# Patient Record
Sex: Female | Born: 2005 | Race: White | Hispanic: No | Marital: Single | Smoking: Never smoker
Health system: Southern US, Community
[De-identification: ages and names within clinical notes are randomized; demographics above are authoritative.]

## PROBLEM LIST (undated history)

## (undated) DIAGNOSIS — F419 Anxiety disorder, unspecified: Secondary | ICD-10-CM

## (undated) DIAGNOSIS — F32A Depression, unspecified: Secondary | ICD-10-CM

## (undated) DIAGNOSIS — F329 Major depressive disorder, single episode, unspecified: Secondary | ICD-10-CM

## (undated) HISTORY — PX: OTHER SURGICAL HISTORY: SHX169

---
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
## 2023-01-01 DIAGNOSIS — Z0000 Encounter for general adult medical examination without abnormal findings: Secondary | ICD-10-CM | POA: Diagnosis not present

## 2023-01-01 DIAGNOSIS — Z130 Encounter for screening for diseases of the blood and blood-forming organs and certain disorders involving the immune mechanism: Secondary | ICD-10-CM | POA: Diagnosis not present

## 2023-01-01 DIAGNOSIS — Z13220 Encounter for screening for lipoid disorders: Secondary | ICD-10-CM | POA: Diagnosis not present

## 2023-01-01 DIAGNOSIS — Z8701 Personal history of pneumonia (recurrent): Secondary | ICD-10-CM | POA: Diagnosis not present

## 2023-01-22 ENCOUNTER — Other Ambulatory Visit (HOSPITAL_COMMUNITY): Payer: Self-pay

## 2023-01-22 ENCOUNTER — Other Ambulatory Visit: Payer: Self-pay

## 2023-01-22 DIAGNOSIS — F411 Generalized anxiety disorder: Secondary | ICD-10-CM | POA: Diagnosis not present

## 2023-01-22 MED ORDER — VILAZODONE HCL 10 MG PO TABS
10.0000 mg | ORAL_TABLET | Freq: Every day | ORAL | 0 refills | Status: AC
  Filled 2023-01-22 – 2023-02-04 (×2): qty 90, 90d supply, fill #0

## 2023-01-23 ENCOUNTER — Other Ambulatory Visit (HOSPITAL_COMMUNITY): Payer: Self-pay

## 2023-01-23 ENCOUNTER — Other Ambulatory Visit: Payer: Self-pay

## 2023-01-28 ENCOUNTER — Other Ambulatory Visit: Payer: Self-pay

## 2023-02-04 ENCOUNTER — Other Ambulatory Visit (HOSPITAL_COMMUNITY): Payer: Self-pay

## 2023-02-04 ENCOUNTER — Other Ambulatory Visit: Payer: Self-pay

## 2023-02-11 ENCOUNTER — Other Ambulatory Visit (HOSPITAL_COMMUNITY): Payer: Self-pay

## 2023-02-20 ENCOUNTER — Other Ambulatory Visit (HOSPITAL_COMMUNITY): Payer: Self-pay

## 2023-02-21 DIAGNOSIS — F411 Generalized anxiety disorder: Secondary | ICD-10-CM | POA: Diagnosis not present

## 2023-02-22 ENCOUNTER — Other Ambulatory Visit: Payer: Self-pay

## 2023-02-22 ENCOUNTER — Other Ambulatory Visit (HOSPITAL_COMMUNITY): Payer: Self-pay

## 2023-02-22 MED ORDER — VILAZODONE HCL 40 MG PO TABS
40.0000 mg | ORAL_TABLET | Freq: Every day | ORAL | 3 refills | Status: AC
Start: 2023-02-22 — End: ?
  Filled 2023-02-22: qty 90, 90d supply, fill #0

## 2023-04-29 ENCOUNTER — Other Ambulatory Visit (HOSPITAL_COMMUNITY): Payer: Self-pay

## 2023-05-10 ENCOUNTER — Other Ambulatory Visit (HOSPITAL_COMMUNITY): Payer: Self-pay

## 2023-05-10 DIAGNOSIS — Z139 Encounter for screening, unspecified: Secondary | ICD-10-CM | POA: Diagnosis not present

## 2023-05-10 DIAGNOSIS — Z01419 Encounter for gynecological examination (general) (routine) without abnormal findings: Secondary | ICD-10-CM | POA: Diagnosis not present

## 2023-05-10 DIAGNOSIS — Z3041 Encounter for surveillance of contraceptive pills: Secondary | ICD-10-CM | POA: Diagnosis not present

## 2023-05-10 DIAGNOSIS — N912 Amenorrhea, unspecified: Secondary | ICD-10-CM | POA: Diagnosis not present

## 2023-05-10 DIAGNOSIS — Z1331 Encounter for screening for depression: Secondary | ICD-10-CM | POA: Diagnosis not present

## 2023-05-13 ENCOUNTER — Other Ambulatory Visit: Payer: Self-pay

## 2023-05-13 ENCOUNTER — Other Ambulatory Visit (HOSPITAL_COMMUNITY): Payer: Self-pay

## 2023-05-13 MED ORDER — NORGESTIMATE-ETH ESTRADIOL 0.25-35 MG-MCG PO TABS
1.0000 | ORAL_TABLET | Freq: Every day | ORAL | 3 refills | Status: DC
  Filled 2023-05-13: qty 84, 84d supply, fill #0
  Filled 2023-07-09 – 2023-07-29 (×2): qty 84, 84d supply, fill #1
  Filled 2023-10-10: qty 84, 84d supply, fill #2
  Filled 2024-01-02: qty 84, 84d supply, fill #3

## 2023-05-17 DIAGNOSIS — F411 Generalized anxiety disorder: Secondary | ICD-10-CM | POA: Diagnosis not present

## 2023-06-18 ENCOUNTER — Other Ambulatory Visit (HOSPITAL_COMMUNITY): Payer: Self-pay

## 2023-07-10 ENCOUNTER — Other Ambulatory Visit: Payer: Self-pay

## 2023-07-29 ENCOUNTER — Other Ambulatory Visit: Payer: Self-pay

## 2023-10-10 ENCOUNTER — Other Ambulatory Visit (HOSPITAL_COMMUNITY): Payer: Self-pay

## 2023-10-10 ENCOUNTER — Other Ambulatory Visit: Payer: Self-pay

## 2023-10-28 DIAGNOSIS — H11442 Conjunctival cysts, left eye: Secondary | ICD-10-CM | POA: Diagnosis not present

## 2023-10-30 DIAGNOSIS — H5701 Argyll Robertson pupil, atypical: Secondary | ICD-10-CM | POA: Diagnosis not present

## 2023-10-30 DIAGNOSIS — H1045 Other chronic allergic conjunctivitis: Secondary | ICD-10-CM | POA: Diagnosis not present

## 2024-01-02 ENCOUNTER — Other Ambulatory Visit (HOSPITAL_COMMUNITY): Payer: Self-pay

## 2024-02-05 DIAGNOSIS — F411 Generalized anxiety disorder: Secondary | ICD-10-CM | POA: Diagnosis not present

## 2024-02-05 DIAGNOSIS — N946 Dysmenorrhea, unspecified: Secondary | ICD-10-CM | POA: Diagnosis not present

## 2024-02-05 DIAGNOSIS — Z0000 Encounter for general adult medical examination without abnormal findings: Secondary | ICD-10-CM | POA: Diagnosis not present

## 2024-02-15 ENCOUNTER — Other Ambulatory Visit (HOSPITAL_COMMUNITY): Payer: Self-pay

## 2024-02-17 ENCOUNTER — Other Ambulatory Visit (HOSPITAL_COMMUNITY): Payer: Self-pay

## 2024-02-17 MED ORDER — NORGESTIMATE-ETH ESTRADIOL 0.25-35 MG-MCG PO TABS: 1.0000 | ORAL_TABLET | Freq: Every day | ORAL | 3 refills | Status: AC

## 2024-02-20 DIAGNOSIS — N946 Dysmenorrhea, unspecified: Secondary | ICD-10-CM | POA: Diagnosis not present

## 2024-03-02 DIAGNOSIS — N946 Dysmenorrhea, unspecified: Secondary | ICD-10-CM | POA: Diagnosis not present

## 2024-04-08 DIAGNOSIS — F419 Anxiety disorder, unspecified: Secondary | ICD-10-CM | POA: Diagnosis not present

## 2024-04-08 DIAGNOSIS — R0789 Other chest pain: Secondary | ICD-10-CM | POA: Diagnosis not present

## 2024-04-09 ENCOUNTER — Emergency Department (HOSPITAL_BASED_OUTPATIENT_CLINIC_OR_DEPARTMENT_OTHER): Admission: EM | Admit: 2024-04-09 | Discharge: 2024-04-09 | Disposition: A | Discharge status: Home / Self Care

## 2024-04-09 ENCOUNTER — Other Ambulatory Visit: Payer: Self-pay

## 2024-04-09 ENCOUNTER — Emergency Department (HOSPITAL_BASED_OUTPATIENT_CLINIC_OR_DEPARTMENT_OTHER): Admitting: Radiology

## 2024-04-09 DIAGNOSIS — R079 Chest pain, unspecified: Secondary | ICD-10-CM | POA: Diagnosis not present

## 2024-04-09 DIAGNOSIS — D72829 Elevated white blood cell count, unspecified: Secondary | ICD-10-CM | POA: Insufficient documentation

## 2024-04-09 DIAGNOSIS — R072 Precordial pain: Secondary | ICD-10-CM | POA: Diagnosis not present

## 2024-04-09 LAB — BASIC METABOLIC PANEL WITH GFR
Anion gap: 14 (ref 5–15)
BUN: 10 mg/dL (ref 6–20)
CO2: 23 mmol/L (ref 22–32)
Calcium: 9.6 mg/dL (ref 8.9–10.3)
Chloride: 102 mmol/L (ref 98–111)
Creatinine, Ser: 0.84 mg/dL (ref 0.44–1.00)
GFR, Estimated: >60 mL/min (ref >60)
Glucose, Bld: 84 mg/dL (ref 70–99)
Potassium: 3.6 mmol/L (ref 3.5–5.1)
Sodium: 138 mmol/L (ref 135–145)

## 2024-04-09 LAB — CBC
HCT: 40.1 % (ref 36.0–46.0)
Hemoglobin: 13.5 g/dL (ref 12.0–15.0)
MCH: 28.8 pg (ref 26.0–34.0)
MCHC: 33.7 g/dL (ref 30.0–36.0)
MCV: 85.7 fL (ref 80.0–100.0)
Platelets: 284 K/uL (ref 150–400)
RBC: 4.68 MIL/uL (ref 3.87–5.11)
RDW: 11.9 % (ref 11.5–15.5)
WBC: 11.6 K/uL — ABNORMAL HIGH (ref 4.0–10.5)
nRBC: 0 % (ref 0.0–0.2)

## 2024-04-09 LAB — TSH: TSH: 0.928 u[IU]/mL (ref 0.350–4.500)

## 2024-04-09 LAB — PREGNANCY, URINE: Preg Test, Ur: NEGATIVE

## 2024-04-09 LAB — TROPONIN T, HIGH SENSITIVITY: Troponin T High Sensitivity: <15 ng/L (ref 0–19)

## 2024-04-09 MED ORDER — ALUM & MAG HYDROXIDE-SIMETH 200-200-20 MG/5ML PO SUSP
30.0000 mL | Freq: Once | ORAL | Status: AC
  Administered 2024-04-09: 30 mL via ORAL
  Filled 2024-04-09: qty 30

## 2024-04-09 MED ORDER — FAMOTIDINE 20 MG PO TABS: 20.0000 mg | ORAL_TABLET | Freq: Two times a day (BID) | ORAL | 0 refills | Status: AC

## 2024-04-09 MED ORDER — FAMOTIDINE 20 MG PO TABS
20.0000 mg | ORAL_TABLET | Freq: Once | ORAL | Status: AC
Start: 2024-04-09 — End: 2024-04-09
  Administered 2024-04-09: 20 mg via ORAL
  Filled 2024-04-09: qty 1

## 2024-04-09 NOTE — ED Provider Notes (Signed)
 [place] EMERGENCY DEPARTMENT AT [facility] Provider Note   CSN: [id number] Arrival date & time: 04/09/24  1341     Patient presents with: Chest Pain   Terri Moreno is a 18 y.o. female.    Chest Pain   18 year old female presents emergency department with complaints of chest pain.  Reports chest pain present for the past 2 days somewhat intermittent in nature.  States that symptoms worsen when she lies down.  Denies any real associated shortness of breath.  States that she initially felt little related to anxiety.  Took her gabapentin but did not help her symptoms.  Denies any fevers, chills, cough, abdominal pain, nausea, vomiting.  Denies any known exertional worsening of symptoms.  Denies any known family history of cardiac complications especially young age.  Denies any feelings of palpitations.  Presents emergency department for further assessment.  Past medical history significant for anxiety/depression.  Prior to Admission medications   Medication Sig Start Date End Date Taking? Authorizing Provider  albuterol  (VENTOLIN  HFA) 108 (90 Base) MCG/ACT inhaler Inhale 2 puffs by mouth every 4 - 6 hours as needed for cough or wheeze (take 2 puffs 30 min before exercise/singing) 04/05/21     amitriptyline  (ELAVIL ) 25 MG tablet TAKE 1 TABLET BY MOUTH AT BEDTIME 10/14/20 10/14/21  Leatrice Eric Cuba, MD  busPIRone  (BUSPAR ) 10 MG tablet Take 1 tablet (10 mg total) by mouth 2 (two) times daily. 12/12/22     desogestrel-ethinyl estradiol  (APRI) 0.15-30 MG-MCG tablet TAKE 1 TABLET BY MOUTH ONCE DAILY 01/07/20 01/06/21  Robinson Pao, MD  escitalopram  (LEXAPRO ) 10 MG tablet Take 1 tablet by mouth every night at bedtime for 30 days 08/15/21     escitalopram  (LEXAPRO ) 5 MG tablet Take 1 tablet by mouth daily. 08/01/21     hydrOXYzine  (ATARAX ) 25 MG tablet Take 1 tablet by mouth every night at bedtime for 30 days 08/15/21     levonorgestrel -ethinyl estradiol  (ALESSE) 0.1-20 MG-MCG  tablet Take 1 tablet by mouth daily. 04/25/22     norgestimate -ethinyl estradiol  (VYLIBRA ) 0.25-35 MG-MCG tablet Take 1 tablet by mouth daily. 02/16/24     ondansetron (ZOFRAN) 8 MG tablet Take 8 mg by mouth every 8 (eight) hours. 07/23/20   [provider]  Vilazodone  HCl (VIIBRYD ) 10 MG TABS Take 1 tablet (10 mg total) by mouth daily with full meal. 01/22/23     Vilazodone  HCl (VIIBRYD ) 40 MG TABS Take 1 tablet (40 mg total) by mouth daily with a full meal. 02/22/23       Allergies: Patient has no known allergies.    Review of Systems  Cardiovascular:  Positive for chest pain.  All other systems reviewed and are negative.   Updated Vital Signs BP 119/73 (BP Location: Right Arm)   Pulse 80   Temp 98 F (36.7 C)   Resp 18   SpO2 100%   Physical Exam Vitals and nursing note reviewed.  Constitutional:      General: She is not in acute distress.    Appearance: She is well-developed.  HENT:     Head: Normocephalic and atraumatic.  Eyes:     Conjunctiva/sclera: Conjunctivae normal.  Cardiovascular:     Rate and Rhythm: Normal rate and regular rhythm.     Heart sounds: No murmur heard. Pulmonary:     Effort: Pulmonary effort is normal. No respiratory distress.     Breath sounds: Normal breath sounds.     Comments: Midsternal chest wall tenderness. Chest:  Chest wall: Tenderness present.  Abdominal:     Palpations: Abdomen is soft.     Tenderness: There is no abdominal tenderness.  Musculoskeletal:        General: No swelling.     Cervical back: Neck supple.  Skin:    General: Skin is warm and dry.     Capillary Refill: Capillary refill takes less than 2 seconds.  Neurological:     Mental Status: She is alert.  Psychiatric:        Mood and Affect: Mood normal.     (all labs ordered are listed, but only abnormal results are displayed) Labs Reviewed  CBC - Abnormal; Notable for the following components:      Result Value   WBC 11.6 (*)    All other  components within normal limits  BASIC METABOLIC PANEL WITH GFR  PREGNANCY, URINE  TSH  TROPONIN T, HIGH SENSITIVITY    EKG: None  Radiology: No results found.   Procedures   Medications Ordered in the ED  alum & mag hydroxide-simeth (MAALOX/MYLANTA) 200-200-20 MG/5ML suspension 30 mL (has no administration in time range)  famotidine  (PEPCID ) tablet 20 mg (20 mg Oral Given 04/09/24 1527)                                    Medical Decision Making Amount and/or Complexity of Data Reviewed Labs: ordered. Radiology: ordered.  Risk OTC drugs.   This patient presents to the ED for concern of chest pain, this involves an extensive number of treatment options, and is a complaint that carries with it a high risk of complications and morbidity.  The differential diagnosis includes ACS PE, pneumothorax, pericarditis/myocarditis/tamponade, MSK, GERD, anxiety, other   Co morbidities that complicate the patient evaluation  See HPI   Additional history obtained:  Additional history obtained from EMR External records from outside source obtained and reviewed including hospital records   Lab Tests:  I Ordered, and personally interpreted labs.  The pertinent results include: You Prag negative.  Troponin negative.  No electrolyte abnormalities.  No renal dysfunction.  Mild leukocytosis 11.6.  Platelets within normal range.  No evidence of anemia.   Imaging Studies ordered:  I ordered imaging studies including chest x-ray I independently visualized and interpreted imaging which showed no acute cardiopulmonary abnormality I agree with the radiologist interpretation   Cardiac Monitoring: / EKG:  The patient was maintained on a cardiac monitor.  I personally viewed and interpreted the cardiac monitored which showed an underlying rhythm of: Sinus rhythm   Consultations Obtained:  N/a   Problem List / ED Course / Critical interventions / Medication management  Chest  pain I ordered medication including Maalox, Pepcid    Reevaluation of the patient after these medicines showed that the patient improved I have reviewed the patients home medicines and have made adjustments as needed   Social Determinants of Health:  Denies tobacco, illicit drug use.   Test / Admission - Considered:  Chest pain Vitals signs within normal range and stable throughout visit. Laboratory/imaging studies significant for: See above  18 year old female presents emergency department with complaints of chest pain.  Reports chest pain present for the past 2 days somewhat intermittent in nature.  States that symptoms worsen when she lies down.  Denies any real associated shortness of breath.  States that she initially felt little related to anxiety.  Took her gabapentin but did not  help her symptoms.  Denies any fevers, chills, cough, abdominal pain, nausea, vomiting.  Denies any known exertional worsening of symptoms.  Denies any known family history of cardiac complications especially young age.  Denies any feelings of palpitations.  Presents emergency department for further assessment. On exam, lungs clear to oscillation bilaterally.  No abdominal tenderness.  Mild central sternal reproducible tenderness.  Workup today reassuring.  Negative troponin, lack of acute ischemic change on EKG; low suspicion for ACS.  Patient with heart score 0-3.  Patient Wells PE 0, PERC negative; low suspicion for PE.  Patient without chest pain rating to back, pulse deficits, neurodeficits, hypertension; low suspicion for aortic dissection.  Chest x-ray without obvious pneumothorax, pneumonia or other acute cardiopulmonary abnormality.  Patient's chest discomfort could be musculoskeletal etiology given reproducible tenderness on exam.  Was treated with GI cocktail and also noted improvement so could be GI related such as GERD/esophagitis/PUD.  Will recommend reflux medication, lifestyle/dietary changes and  follow-up with primary care for reassessment.  Treatment plan discussed with patient and she is understanding was agreeable.  Patient well-appearing, afebrile in no acute distress upon discharge. Worrisome signs and symptoms were discussed with the patient, and the patient acknowledged understanding to return to the ED if noticed. Patient was stable upon discharge.       Final diagnoses:  None    ED Discharge Orders     None          [place] LABOR, [place] 04/09/24 1638    Neysa Caron PARAS, DO 04/10/24 1545

## 2024-04-09 NOTE — ED Triage Notes (Signed)
 C/o central CP that rads into left rib cage x 2 days. Denies SHOB. States pain is worse when laying down.

## 2024-04-09 NOTE — Discharge Instructions (Addendum)
 As discussed, your workup today was reassuring.  Heart enzyme and EKG appeared normal.  Chest x-ray appeared normal.  Given improvement with the GI cocktail, suspect your symptoms could be related to GERD versus gastritis versus peptic ulcer.  Will treat with reflux medicine as needed.  See instructions to discharge papers regarding dietary/lifestyle changes you can do to help with your symptoms as well.  Recommend follow-up with your primary care for reassessment.
